# Patient Record
Sex: Male | Born: 1975 | Race: Black or African American | Hispanic: No | Marital: Married | Smoking: Current every day smoker
Health system: Southern US, Community
[De-identification: ages and names within clinical notes are randomized; demographics above are authoritative.]

## PROBLEM LIST (undated history)

## (undated) DIAGNOSIS — I209 Angina pectoris, unspecified: Secondary | ICD-10-CM

## (undated) DIAGNOSIS — M109 Gout, unspecified: Secondary | ICD-10-CM

---

## 2000-05-05 ENCOUNTER — Emergency Department (HOSPITAL_COMMUNITY): Admission: EM | Admit: 2000-05-05 | Discharge: 2000-05-05 | Payer: Self-pay | Admitting: Emergency Medicine

## 2000-05-05 ENCOUNTER — Encounter: Payer: Self-pay | Admitting: Emergency Medicine

## 2003-02-06 ENCOUNTER — Emergency Department (HOSPITAL_COMMUNITY): Admission: EM | Admit: 2003-02-06 | Discharge: 2003-02-06 | Payer: Self-pay | Admitting: Emergency Medicine

## 2009-10-02 ENCOUNTER — Emergency Department (HOSPITAL_BASED_OUTPATIENT_CLINIC_OR_DEPARTMENT_OTHER): Admission: EM | Admit: 2009-10-02 | Discharge: 2009-10-02 | Payer: Self-pay | Admitting: Emergency Medicine

## 2010-07-30 ENCOUNTER — Emergency Department (HOSPITAL_BASED_OUTPATIENT_CLINIC_OR_DEPARTMENT_OTHER): Admission: EM | Admit: 2010-07-30 | Discharge: 2010-07-30 | Payer: Self-pay | Admitting: Emergency Medicine

## 2010-08-18 ENCOUNTER — Encounter
Admission: RE | Admit: 2010-08-18 | Discharge: 2010-08-18 | Payer: Self-pay | Source: Home / Self Care | Attending: Gastroenterology | Admitting: Gastroenterology

## 2010-11-16 LAB — CBC
HCT: 39 % (ref 39.0–52.0)
Hemoglobin: 13.3 g/dL (ref 13.0–17.0)
MCH: 26.9 pg (ref 26.0–34.0)
MCHC: 34 g/dL (ref 30.0–36.0)
MCV: 79.2 fL (ref 78.0–100.0)
Platelets: 217 10*3/uL (ref 150–400)
RBC: 4.93 MIL/uL (ref 4.22–5.81)
RDW: 12.8 % (ref 11.5–15.5)
WBC: 7.2 10*3/uL (ref 4.0–10.5)

## 2010-11-16 LAB — DIFFERENTIAL
Basophils Absolute: 0.1 10*3/uL (ref 0.0–0.1)
Basophils Relative: 1 % (ref 0–1)
Eosinophils Absolute: 0.1 10*3/uL (ref 0.0–0.7)
Eosinophils Relative: 2 % (ref 0–5)
Lymphocytes Relative: 55 % — ABNORMAL HIGH (ref 12–46)
Lymphs Abs: 3.9 10*3/uL (ref 0.7–4.0)
Monocytes Absolute: 0.5 10*3/uL (ref 0.1–1.0)
Monocytes Relative: 7 % (ref 3–12)
Neutro Abs: 2.6 10*3/uL (ref 1.7–7.7)
Neutrophils Relative %: 36 % — ABNORMAL LOW (ref 43–77)

## 2010-11-16 LAB — COMPREHENSIVE METABOLIC PANEL
ALT: 30 U/L (ref 0–53)
AST: 30 U/L (ref 0–37)
Albumin: 3.4 g/dL — ABNORMAL LOW (ref 3.5–5.2)
Alkaline Phosphatase: 59 U/L (ref 39–117)
BUN: 16 mg/dL (ref 6–23)
CO2: 25 mEq/L (ref 19–32)
Calcium: 8.7 mg/dL (ref 8.4–10.5)
Chloride: 111 mEq/L (ref 96–112)
Creatinine, Ser: 1.3 mg/dL (ref 0.4–1.5)
GFR calc Af Amer: >60 mL/min (ref >60)
GFR calc non Af Amer: >60 mL/min (ref >60)
Glucose, Bld: 96 mg/dL (ref 70–99)
Potassium: 4 mEq/L (ref 3.5–5.1)
Sodium: 143 mEq/L (ref 135–145)
Total Bilirubin: 0.4 mg/dL (ref 0.3–1.2)
Total Protein: 5.9 g/dL — ABNORMAL LOW (ref 6.0–8.3)

## 2010-11-16 LAB — KETONES, QUALITATIVE: Acetone, Bld: NEGATIVE

## 2014-01-09 ENCOUNTER — Emergency Department (HOSPITAL_BASED_OUTPATIENT_CLINIC_OR_DEPARTMENT_OTHER)
Admission: EM | Admit: 2014-01-09 | Discharge: 2014-01-09 | Disposition: A | Discharge status: Home / Self Care | Payer: Self-pay | Attending: Emergency Medicine | Admitting: Emergency Medicine

## 2014-01-09 ENCOUNTER — Encounter (HOSPITAL_BASED_OUTPATIENT_CLINIC_OR_DEPARTMENT_OTHER): Payer: Self-pay | Admitting: Emergency Medicine

## 2014-01-09 ENCOUNTER — Emergency Department (HOSPITAL_BASED_OUTPATIENT_CLINIC_OR_DEPARTMENT_OTHER): Payer: Self-pay

## 2014-01-09 DIAGNOSIS — S61219A Laceration without foreign body of unspecified finger without damage to nail, initial encounter: Secondary | ICD-10-CM

## 2014-01-09 DIAGNOSIS — Y929 Unspecified place or not applicable: Secondary | ICD-10-CM | POA: Insufficient documentation

## 2014-01-09 DIAGNOSIS — S61209A Unspecified open wound of unspecified finger without damage to nail, initial encounter: Secondary | ICD-10-CM | POA: Insufficient documentation

## 2014-01-09 DIAGNOSIS — F17200 Nicotine dependence, unspecified, uncomplicated: Secondary | ICD-10-CM | POA: Insufficient documentation

## 2014-01-09 DIAGNOSIS — I209 Angina pectoris, unspecified: Secondary | ICD-10-CM | POA: Insufficient documentation

## 2014-01-09 DIAGNOSIS — W269XXA Contact with unspecified sharp object(s), initial encounter: Secondary | ICD-10-CM | POA: Insufficient documentation

## 2014-01-09 DIAGNOSIS — Z8719 Personal history of other diseases of the digestive system: Secondary | ICD-10-CM | POA: Insufficient documentation

## 2014-01-09 DIAGNOSIS — Y9389 Activity, other specified: Secondary | ICD-10-CM | POA: Insufficient documentation

## 2014-01-09 DIAGNOSIS — Z87448 Personal history of other diseases of urinary system: Secondary | ICD-10-CM | POA: Insufficient documentation

## 2014-01-09 DIAGNOSIS — Z23 Encounter for immunization: Secondary | ICD-10-CM | POA: Insufficient documentation

## 2014-01-09 HISTORY — DX: Gout, unspecified: M10.9

## 2014-01-09 HISTORY — DX: Angina pectoris, unspecified: I20.9

## 2014-01-09 MED ORDER — TETANUS-DIPHTH-ACELL PERTUSSIS 5-2.5-18.5 LF-MCG/0.5 IM SUSP
0.5000 mL | Freq: Once | INTRAMUSCULAR | Status: AC
  Administered 2014-01-09: 0.5 mL via INTRAMUSCULAR
  Filled 2014-01-09: qty 0.5

## 2014-01-09 MED ORDER — OXYCODONE-ACETAMINOPHEN 5-325 MG PO TABS: 1.0000 | ORAL_TABLET | ORAL | Status: AC | PRN

## 2014-01-09 MED ORDER — OXYCODONE-ACETAMINOPHEN 5-325 MG PO TABS: 1.0000 | ORAL_TABLET | ORAL | Status: DC | PRN

## 2014-01-09 MED ORDER — OXYCODONE-ACETAMINOPHEN 5-325 MG PO TABS
1.0000 | ORAL_TABLET | Freq: Once | ORAL | Status: AC
  Administered 2014-01-09: 1 via ORAL
  Filled 2014-01-09: qty 1

## 2014-01-09 NOTE — ED Notes (Signed)
Supplies gathered and placed at bedside for md. Pt is in nad, talking and laughing with this rn and supervisor who is at bedside.

## 2014-01-09 NOTE — ED Provider Notes (Signed)
CSN: [id number]     Arrival date & time 01/09/14  [id number] History   First MD Initiated Contact with Patient 01/09/14 [phone]     Chief Complaint  Patient presents with  . Finger Injury   HPI  38 y.o. male with h/o angina and gout here after he cut his left third digit with a router saw. He is in 9/10 pain. States wound did not bleed much after the injury. He has not had similar injury in the past. He cannot recall his last tetanus shot but thinks he was a child.  Past Medical History  Diagnosis Date  . Angina pectoris   . Gout   Pancreatitis GERD No PCP  History reviewed. No pertinent past surgical history. History reviewed. No pertinent family history. History  Substance Use Topics  . Smoking status: Current Every Day Smoker  . Smokeless tobacco: Not on file  . Alcohol Use: Not on file  Smokes 2-3 cigarettes daily Denies drug use Drinks once every 2-3 months but unable to [employer] Lives with wife and 5 of his 6 kids Works in [employer]  Review of Systems - Per HPI   Allergies  Dilaudid  Home Medications   Prior to Admission medications   Not on File   BP 122/91  Pulse 80  Temp(Src) 98.3 F (36.8 C) (Oral)  Resp 18  SpO2 100% Physical Exam GEN: NAD, pleasant HEENT: Atraumatic, normocephalic, neck supple, EOMI, sclera clear CV: RRR, no murmurs, rubs, or gallops, good cap refill bilaterally all fingers, 1+ bilateral DP pulse PULM: CTAB, normal effort ABD: Soft, nontender, nondistended SKIN: No rash or cyanosis; warm and well-perfused  EXTR: Left third digit with 2cm jagged bleeding laceration at distal palmar surface. Normal flexion and extension of this digit against resistance, though with significant tenderness.  PSYCH: Mood and affect euthymic, normal rate and volume of speech NEURO: Awake, alert, no focal deficits grossly, normal speech  ED Course  LACERATION REPAIR Date/Time: 01/09/2014 10:59 AM Performed by: Leona SingletonHEKKEKANDAM, Magon Croson T Authorized by:  Joya GaskinsWICKLINE, DONALD W Consent: Verbal consent obtained. Risks and benefits: risks, benefits and alternatives were discussed Consent given by: patient Patient understanding: patient states understanding of the procedure being performed Patient consent: the patient's understanding of the procedure matches consent given Procedure consent: procedure consent matches procedure scheduled Imaging studies: imaging studies available Patient identity confirmed: verbally with patient Body area: upper extremity Location details: left long finger Laceration length: 2.5 cm Foreign bodies: no foreign bodies Tendon involvement: none Nerve involvement: none Vascular damage: no Anesthesia: local infiltration Local anesthetic: lidocaine 1% without epinephrine Anesthetic total: 4 ml Patient sedated: no Preparation: Patient was prepped and draped in the usual sterile fashion. Irrigation solution: saline Irrigation method: jet lavage Amount of cleaning: standard Debridement: minimal Skin closure: 3-0 nylon Number of sutures: 3 Technique: simple Approximation: loose Approximation difficulty: simple Dressing: 4x4 sterile gauze, non-adhesive packing strip, antibiotic ointment and splint Patient tolerance: Patient tolerated the procedure well with no immediate complications.   (including critical care time) Labs Review Labs Reviewed - No data to display  Imaging Review Dg Hand Complete Left  01/09/2014   CLINICAL DATA:  Laceration to the left long finger.  EXAM: LEFT HAND - COMPLETE 3+ VIEW  COMPARISON:  None.  FINDINGS: The osseous structures are normal. No radiodense foreign body. No arthritis or other abnormality.  IMPRESSION: Normal exam.   Electronically Signed   By: Geanie CooleyJim  Maxwell M.D.   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 Initially cleaned with beta-dyne bath and cleaned/prepped again  with beta-dyne. Suture closed - see procedure note above. Wound check with no tendon laceration. Patient was neurovascularly intact - TDAP because patient has not had one in >10 years. - Left hand xray showed no foreign body or fracture. - percocet x 1 - check for fb or tendon lac - none - work note provided and pt instructed to keep wound wrapped for 24 hours and then clean and dry. - Suture removal in 10-14 days. - Return precautions reviewed. - D/c'ed with Percocet # 10.  Leona SingletonMaria T Arwa Yero, MD PGY-2, [facility]   Leona SingletonMaria T Yaritzel Stange, MD 01/09/14 [id number]

## 2014-01-09 NOTE — Discharge Instructions (Signed)
Your finger was stitched up. Keep the site clean and dry. Keep the dressing on for 24 hours, then take off and check wound and redress. You can leave it open to the air if you are not doing things that will get it dirty. If you develop any worsened symptoms, fever, pus, or increased swelling, seek immediate care. Have sutures removed by a healthcare professional in 10-14 days.   Emergency Department Resource Guide 1) Find a Doctor and Pay Out of Pocket Although you won't have to find out who is covered by your insurance plan, it is a good idea to ask around and get recommendations. You will then need to call the office and see if the doctor you have chosen will accept you as a new patient and what types of options they offer for patients who are self-pay. Some doctors offer discounts or will set up payment plans for their patients who do not have insurance, but you will need to ask so you aren't surprised when you get to your appointment.  2) Contact Your Local Health Department Not all health departments have doctors that can see patients for sick visits, but many do, so it is worth a call to see if yours does. If you don't know where your local health department is, you can check in your phone book. The CDC also has a tool to help you locate your state's health department, and many state websites also have listings of all of their local health departments.  3) Find a Walk-in Clinic If your illness is not likely to be very severe or complicated, you may want to try a walk in clinic. These are popping up all over the country in pharmacies, drugstores, and shopping centers. They're usually staffed by nurse practitioners or physician assistants that have been trained to treat common illnesses and complaints. They're usually fairly quick and inexpensive. However, if you have serious medical issues or chronic medical problems, these are probably not your best option.  No Primary Care Doctor: - Call  Health Connect at  [phone] - they can help you locate a primary care doctor that  accepts your insurance, provides certain services, etc. - Physician Referral Service- [phone]  Chronic Pain Problems: Organization         Address  Phone   Notes  [facility]  [phone] Patients need to be referred by their primary care doctor.   Medication Assistance: Organization         Address  Phone   Notes  [facility] [address] [place], [place] [address] [phone] --Must be a resident of [facility] -- Must have NO insurance coverage whatsoever (no Medicaid/ Medicare, etc.) -- The pt. MUST have a primary care doctor that directs their care regularly and follows them in the community   [facility]  [phone]) [phone]   [employer]  [phone]    Agencies that provide inexpensive medical care: Organization         Address  Phone   Notes  [facility]  [phone]   [facility]    [phone]   [facility] [address] [place], [place] [address] [phone]   [facility] [address], [place] [phone]   [facility]    [phone]) [phone]   [facility]    [phone]) [phone]   [facility]  [address], [place] Phone:  [phone]) [phone], Fax:  [phone] Hours of Operation:  9 am - 6 pm, M-F.  Also accepts Medicaid/Medicare and self-pay.  [facility]  [address], [place] Phone: [phone](336) [phone], Fax: [phone]. Hours of Operation:  8:30 am - 5:30 pm, M-F.  Also accepts Medicaid and self-pay.  [facility] [place] [address], [place] Phone: [phone](336) [phone]   [facility] [address], [place], [place] [phone], Ext. [phone] Mondays & Thursdays: 7-9 AM.  First 15 patients are seen on a first come, first serve  basis.    Medicaid-accepting [facility] Providers:  Organization         Address  Phone   Notes  [facility] [address], [place] [phone] Also accepts self-pay patients.  [facility] [address], [place]  [phone]) [phone]   [facility] [address], [place] [phone]) [phone]   [facility] [address] [place] [address], [place] [phone]   Renaye RakersVeita Bland [address], [place]   [phone] Only accepts [place] patients after they have their name applied to their card.   Self-Pay (no insurance) in [facility] [place]:  Organization         Address  Phone   Notes  Sickle Cell Patients, [facility] [address], [place] [phone]   [facility] [address], [place] [phone]   [facility]  [address] [place] [address], [place] [phone]   [facility]/Dr. Osei-Bonsu  [address] [place] [address], [place] or [id number], [place] [phone] Phone number for both [place] and [place] locations is the same.  [facility] [address], [place] [phone]   [facility]  [address] [place] [address], [place] or [address] [phone]   [facility] [address], [place] [phone], phone; [phone]) [phone], fax Sees patients 1st and 3rd Saturday of every month.  Must not qualify for public or private insurance (i.e. Medicaid, Medicare, [place] Health Choice, Veterans' Benefits)  Household income should be no more than 200% of the poverty level The clinic cannot treat you if you are pregnant or think you are pregnant  Sexually transmitted diseases are not treated at the clinic.

## 2014-01-09 NOTE — ED Provider Notes (Signed)
Patient seen/examined in the Emergency Department in conjunction with Resident Physician Provider  Patient reports finger pain s/p laceration sustained at work Exam : awake/alert, laceration noted to left third finger, bleeding controlled Plan: will need repair then d/c home Xray reviewed and is negative for acute fracture   Joya Gaskinsonald W Sharmon Cheramie, MD 01/09/14 [id number]

## 2014-01-09 NOTE — ED Provider Notes (Signed)
I have personally seen and examined the patient.  I have discussed plan of care with resident.  I was present for key and critical portions of procedure as documented. I have reviewed the appropriate documentation on PMH/FH/Soc. History.  I have reviewed the documentation of the resident and agree.   [name], MD 01/09/14 1500

## 2014-01-09 NOTE — ED Notes (Signed)
Pt amb to room 1 with quick steady gait in nad. Pt reports cutting his left third digit with a router while at work. dsd in place, bandage removed to reveal minimal amt of bleeding and irregular laceration to tip of finger. Soaked in betadine and saline. Pt is unsure of td status. Denies any other injury or c/o.

## 2014-01-09 NOTE — ED Notes (Signed)
Patient transported to X-ray 

## 2014-01-20 ENCOUNTER — Encounter (HOSPITAL_BASED_OUTPATIENT_CLINIC_OR_DEPARTMENT_OTHER): Payer: Self-pay | Admitting: Emergency Medicine

## 2014-01-20 ENCOUNTER — Emergency Department (HOSPITAL_BASED_OUTPATIENT_CLINIC_OR_DEPARTMENT_OTHER)
Admission: EM | Admit: 2014-01-20 | Discharge: 2014-01-20 | Disposition: A | Discharge status: Home / Self Care | Payer: Self-pay | Attending: Emergency Medicine | Admitting: Emergency Medicine

## 2014-01-20 DIAGNOSIS — F17200 Nicotine dependence, unspecified, uncomplicated: Secondary | ICD-10-CM | POA: Insufficient documentation

## 2014-01-20 DIAGNOSIS — Z8679 Personal history of other diseases of the circulatory system: Secondary | ICD-10-CM | POA: Insufficient documentation

## 2014-01-20 DIAGNOSIS — Z4802 Encounter for removal of sutures: Secondary | ICD-10-CM | POA: Insufficient documentation

## 2014-01-20 DIAGNOSIS — Z862 Personal history of diseases of the blood and blood-forming organs and certain disorders involving the immune mechanism: Secondary | ICD-10-CM | POA: Insufficient documentation

## 2014-01-20 DIAGNOSIS — Z8639 Personal history of other endocrine, nutritional and metabolic disease: Secondary | ICD-10-CM | POA: Insufficient documentation

## 2014-01-20 NOTE — ED Provider Notes (Signed)
CSN: [id number]     Arrival date & time 01/20/14  1249 History   First MD Initiated Contact with Patient 01/20/14 1304     Chief Complaint  Patient presents with  . Suture / Staple Removal     (Consider location/radiation/quality/duration/timing/severity/associated sxs/prior Treatment) Patient is a 38 y.o. male presenting with suture removal.  Suture / Staple Removal   Pt here for wound check. Has sutures placed in L middle finger after saw injury 11 days ago. Reports some increase in pain, but no swelling or drainage.   Past Medical History  Diagnosis Date  . Angina pectoris   . Gout    History reviewed. No pertinent past surgical history. History reviewed. No pertinent family history. History  Substance Use Topics  . Smoking status: Current Every Day Smoker  . Smokeless tobacco: Not on file  . Alcohol Use: Not on file    Review of Systems All other systems reviewed and are negative except as noted in HPI.     Allergies  Dilaudid  Home Medications   Prior to Admission medications   Medication Sig Start Date End Date Taking? Authorizing Provider  oxyCODONE-acetaminophen (ROXICET) 5-325 MG per tablet Take 1 tablet by mouth every 4 (four) hours as needed for severe pain. 01/09/14   Leona SingletonMaria T Thekkekandam, MD   BP 154/99  Pulse 69  Temp(Src) 98.2 F (36.8 C)  Resp 16  Ht 5\' 8"  (1.727 m)  Wt 170 lb (77.111 kg)  BMI 25.85 kg/m2  SpO2 100% Physical Exam  Constitutional: He is oriented to person, place, and time. He appears well-developed and well-nourished.  HENT:  Head: Normocephalic and atraumatic.  Neck: Neck supple.  Pulmonary/Chest: Effort normal.  Musculoskeletal: He exhibits no edema and no tenderness.  Sutures in place in ?flap over L 3rd finger pad injury. The flap looks partially necrosed and may not be viable, no signs of infection  Neurological: He is alert and oriented to person, place, and time. No cranial nerve deficit.  Psychiatric: He has a normal  mood and affect. His behavior is normal.    ED Course  Procedures (including critical care time) Labs Review Labs Reviewed - No data to display  Imaging Review No results found.   EKG Interpretation None      MDM   Final diagnoses:  Visit for suture removal    Sutures removed x 3. Part of the necrosed flap has come off with normal healing granulation tissue underneath. Advised continued use of antibiotic ointment, protect finger to allow for continued healing.    Charles B. Bernette MayersSheldon, MD 01/20/14 1316

## 2014-01-20 NOTE — Discharge Instructions (Signed)
Suture Removal, Care After Refer to this sheet in the next few weeks. These instructions provide you with information on caring for yourself after your procedure. Your health care provider may also give you more specific instructions. Your treatment has been planned according to current medical practices, but problems sometimes occur. Call your health care provider if you have any problems or questions after your procedure. WHAT TO EXPECT AFTER THE PROCEDURE After your stitches (sutures) are removed, it is typical to have the following:  Some discomfort and swelling in the wound area.  Slight redness in the area. HOME CARE INSTRUCTIONS   If you have skin adhesive strips over the wound area, do not take the strips off. They will fall off on their own in a few days. If the strips remain in place after 14 days, you may remove them.  Change any bandages (dressings) at least once a day or as directed by your health care provider. If the bandage sticks, soak it off with warm, soapy water.  Apply cream or ointment only as directed by your health care provider. If using cream or ointment, wash the area with soap and water 2 times a day to remove all the cream or ointment. Rinse off the soap and pat the area dry with a clean towel.  Keep the wound area dry and clean. If the bandage becomes wet or dirty, or if it develops a bad smell, change it as soon as possible.  Continue to protect the wound from injury.  Use sunscreen when out in the sun. New scars become sunburned easily. SEEK MEDICAL CARE IF:  You have increasing redness, swelling, or pain in the wound.  You see pus coming from the wound.  You have a fever.  You notice a bad smell coming from the wound or dressing.  Your wound breaks open (edges not staying together). Document Released: 05/17/2001 Document Revised: 06/12/2013 Document Reviewed: 04/03/2013 ExitCare Patient Information 2014 [employer].  

## 2014-01-20 NOTE — ED Notes (Signed)
Here for suture removal last seen 5/7

## 2014-01-22 NOTE — ED Notes (Signed)
Pt called and sts he did not receive work note during last visit. Note was printed and this nurse re-printed. Pt will pick up at front desk.

## 2016-09-03 ENCOUNTER — Emergency Department (HOSPITAL_BASED_OUTPATIENT_CLINIC_OR_DEPARTMENT_OTHER)
Admission: EM | Admit: 2016-09-03 | Discharge: 2016-09-03 | Disposition: A | Discharge status: Home / Self Care | Payer: BLUE CROSS/BLUE SHIELD | Attending: Emergency Medicine | Admitting: Emergency Medicine

## 2016-09-03 ENCOUNTER — Encounter (HOSPITAL_BASED_OUTPATIENT_CLINIC_OR_DEPARTMENT_OTHER): Payer: Self-pay | Admitting: *Deleted

## 2016-09-03 ENCOUNTER — Emergency Department (HOSPITAL_BASED_OUTPATIENT_CLINIC_OR_DEPARTMENT_OTHER): Payer: BLUE CROSS/BLUE SHIELD

## 2016-09-03 DIAGNOSIS — Y939 Activity, unspecified: Secondary | ICD-10-CM | POA: Insufficient documentation

## 2016-09-03 DIAGNOSIS — F17210 Nicotine dependence, cigarettes, uncomplicated: Secondary | ICD-10-CM | POA: Diagnosis not present

## 2016-09-03 DIAGNOSIS — Y929 Unspecified place or not applicable: Secondary | ICD-10-CM | POA: Diagnosis not present

## 2016-09-03 DIAGNOSIS — Y999 Unspecified external cause status: Secondary | ICD-10-CM | POA: Insufficient documentation

## 2016-09-03 DIAGNOSIS — S39012A Strain of muscle, fascia and tendon of lower back, initial encounter: Secondary | ICD-10-CM | POA: Diagnosis not present

## 2016-09-03 DIAGNOSIS — S3992XA Unspecified injury of lower back, initial encounter: Secondary | ICD-10-CM | POA: Diagnosis present

## 2016-09-03 DIAGNOSIS — X58XXXA Exposure to other specified factors, initial encounter: Secondary | ICD-10-CM | POA: Diagnosis not present

## 2016-09-03 MED ORDER — HYDROCODONE-ACETAMINOPHEN 5-325 MG PO TABS: 2.0000 | ORAL_TABLET | ORAL | 0 refills | Status: AC | PRN

## 2016-09-03 MED ORDER — KETOROLAC TROMETHAMINE 30 MG/ML IJ SOLN
30.0000 mg | Freq: Once | INTRAMUSCULAR | Status: AC
  Administered 2016-09-03: 30 mg via INTRAMUSCULAR
  Filled 2016-09-03: qty 1

## 2016-09-03 MED ORDER — CYCLOBENZAPRINE HCL 10 MG PO TABS: 10.0000 mg | ORAL_TABLET | Freq: Two times a day (BID) | ORAL | 0 refills | Status: AC | PRN

## 2016-09-03 MED ORDER — HYDROCODONE-ACETAMINOPHEN 5-325 MG PO TABS
1.0000 | ORAL_TABLET | Freq: Once | ORAL | Status: AC
  Administered 2016-09-03: 1 via ORAL
  Filled 2016-09-03: qty 1

## 2016-09-03 MED ORDER — METHYLPREDNISOLONE 4 MG PO TBPK: ORAL_TABLET | ORAL | 0 refills | Status: AC

## 2016-09-03 NOTE — Discharge Instructions (Signed)
Your x-ray shows no acute findings. This is likely a muscular sprain. I have given you a short course of steroids. I also given a prescription for Flexeril which is a muscle relaxer and a few pain pills. Please try to avoid taking these at the same time as they can cause drowsiness. Do not drive with this medications. Avoid taking NSAIDs for the rest of the day. Continue using a heating pad and alternate with ice to your lower back. I have given you a follow-up referral [facility] if your symptoms do not improve in the next 2-3 days. Return to the ED if he develop any loss of your bowel or bladder, or unable to urinate, have numbness and tingling in her groin during her lower extremities.

## 2016-09-03 NOTE — ED Notes (Signed)
ED Provider at bedside. 

## 2016-09-03 NOTE — ED Provider Notes (Signed)
[facility] Provider Note   CSN: [id number] Arrival date & time: 09/03/16  1001     History   Chief Complaint Chief Complaint  Patient presents with  . Back Pain    HPI Glenn Davidson is a 40 y.o. male.  40 year old African-American male with no significant past medical history presents to the ED today with complaint of low back pain 1 week. Patient states that he was going from a sitting to standing position when he coughed and experienced sudden severe back pain. Patient states the pain radiated down his left leg which has since resolved. He states. Makes the pain worse. He has tried Tylenol and ibuprofen with some relief. Patient states that he's had back injury in the past due to football trauma in high school. Patient does report transient relief with ice application. He denies any loss of bowel or bladder, urinary retention, saddle paresthesias, lower extremity paresthesias, history of IV drug use, history of cancer. Denies any fever, chills, headache, vision changes, neck pain, nausea, emesis, urinary symptoms, change in bowel habits, numbness/tingling. Patient is able to ambulate but with pain.      Past Medical History:  Diagnosis Date  . Angina pectoris (HCC)   . Gout     There are no active problems to display for this patient.   History reviewed. No pertinent surgical history.     Home Medications    Prior to Admission medications   Medication Sig Start Date End Date Taking? Authorizing Provider  oxyCODONE-acetaminophen (ROXICET) 5-325 MG per tablet Take 1 tablet by mouth every 4 (four) hours as needed for severe pain. 01/09/14   Leona SingletonMaria T Thekkekandam, MD    Family History No family history on file.  Social History Social History  Substance Use Topics  . Smoking status: Current Every Day Smoker    Types: Cigarettes  . Smokeless tobacco: Never Used  . Alcohol use Yes     Comment: occ     Allergies   Dilaudid [hydromorphone  hcl]   Review of Systems Review of Systems  Constitutional: Negative for chills and fever.  Musculoskeletal: Positive for back pain and gait problem. Negative for neck pain and neck stiffness.  Skin: Negative.   Neurological: Negative for dizziness, weakness, light-headedness and headaches.  All other systems reviewed and are negative.    Physical Exam Updated Vital Signs BP 126/91 (BP Location: Right Arm)   Pulse (!) 59   Temp 98.6 F (37 C) (Oral)   Resp 18   Ht 5\' 7"  (1.702 m)   Wt 79.4 kg   SpO2 100%   BMI 27.41 kg/m   Physical Exam  Constitutional: He is oriented to person, place, and time. He appears well-developed and well-nourished. No distress.  HENT:  Head: Normocephalic and atraumatic.  Eyes: Conjunctivae and EOM are normal. Pupils are equal, round, and reactive to light.  Neck: Normal range of motion. Neck supple.  No midline C-spine tenderness. No deformity or step-offs noted.  Cardiovascular: Normal rate and regular rhythm.   Musculoskeletal:  Patient ambulated to the room with no issues and gait. He does have midline L-spine tenderness. No deformity or step-offs noted. Patient also exhibits right paraspinal lumbar tenderness with palpation that radiates to his left buttocks. Patient has no midline T-spine tenderness. No deformity or step-offs noted. Full range of motion. Strength is 5 out of 5 in lower extremities. Sensation is intact to sharp/dull and proprioception. DP pulses are 2+ bilaterally. Cap refill is normal.  Lymphadenopathy:  He has no cervical adenopathy.  Neurological: He is alert and oriented to person, place, and time. GCS eye subscore is 4. GCS verbal subscore is 5. GCS motor subscore is 6.  Reflex Scores:      Patellar reflexes are 2+ on the right side and 2+ on the left side.      Achilles reflexes are 2+ on the right side and 2+ on the left side. Skin: Skin is warm and dry. Capillary refill takes less than 2 seconds.  Nursing note and  vitals reviewed.    ED Treatments / Results  Labs (all labs ordered are listed, but only abnormal results are displayed) Labs Reviewed - No data to display  EKG  EKG Interpretation None       Radiology No results found.  Procedures Procedures (including critical care time)  Medications Ordered in ED Medications  ketorolac (TORADOL) 30 MG/ML injection 30 mg (30 mg Intramuscular Given 09/03/16 1109)  HYDROcodone-acetaminophen (NORCO/VICODIN) 5-325 MG per tablet 1 tablet (1 tablet Oral Given 09/03/16 1109)     Initial Impression / Assessment and Plan / ED Course  I have reviewed the triage vital signs and the nursing notes.  Pertinent labs & imaging results that were available during my care of the patient were reviewed by me and considered in my medical decision making (see chart for details).  Clinical Course   Patient with back pain.  No neurological deficits and normal neuro exam.  Patient can walk but states is painful.  No loss of bowel or bladder control.  No concern for cauda equina.  No fever, night sweats, weight loss, h/o cancer, IVDU. Xray without acute changes shows known injury that is unchanged from previous injury. Will discharge with steroids, muscle relaxer and short course of pain medicine. Patient given referral to [facility] If Symptoms Do Not Improve. RICE protocol and pain medicine indicated and discussed with patient. Pt is hemodynamically stable, in NAD, & able to ambulate in the ED. Pain has been managed & has no complaints prior to dc. Pt is comfortable with above plan and is stable for discharge at this time. All questions were answered prior to disposition. Strict return precautions for f/u to the ED were discussed.    Final Clinical Impressions(s) / ED Diagnoses   Final diagnoses:  Strain of lumbar region, initial encounter    New Prescriptions Discharge Medication List as of 09/03/2016 11:18 AM    START taking these medications    Details  cyclobenzaprine (FLEXERIL) 10 MG tablet Take 1 tablet (10 mg total) by mouth 2 (two) times daily as needed for muscle spasms., Starting Sat 09/03/2016, Print    HYDROcodone-acetaminophen (NORCO/VICODIN) 5-325 MG tablet Take 2 tablets by mouth every 4 (four) hours as needed., Starting Sat 09/03/2016, Print    methylPREDNISolone (MEDROL DOSEPAK) 4 MG TBPK tablet 6 po on day 1, decrease by 1 tab per day, Print         Rise MuKenneth T Carlo Lorson, PA-C 09/03/16 1137    Rise MuKenneth T Swetha Rayle, PA-C 09/03/16 1140    Rolland PorterMark James, MD 09/17/16 [phone]

## 2016-09-03 NOTE — ED Triage Notes (Addendum)
Pt reports lower back pain x1wk. Pt states he was going from sitting to standing when he coughed and experienced sudden, severe back pain. Denies fever, n/v/d, genitourinary symptoms, incontinence, chest pain, sob. States pain sometimes radiates down L leg. Reports transient relief with ice application, Motrin and Tylenol. Reports hx of back injury in the past.

## 2021-09-13 ENCOUNTER — Emergency Department (HOSPITAL_BASED_OUTPATIENT_CLINIC_OR_DEPARTMENT_OTHER)
Admission: EM | Admit: 2021-09-13 | Discharge: 2021-09-13 | Disposition: A | Discharge status: Home / Self Care | Payer: BLUE CROSS/BLUE SHIELD | Attending: Emergency Medicine | Admitting: Emergency Medicine

## 2021-09-13 ENCOUNTER — Other Ambulatory Visit: Payer: Self-pay

## 2021-09-13 ENCOUNTER — Encounter (HOSPITAL_BASED_OUTPATIENT_CLINIC_OR_DEPARTMENT_OTHER): Payer: Self-pay | Admitting: *Deleted

## 2021-09-13 DIAGNOSIS — M545 Low back pain, unspecified: Secondary | ICD-10-CM

## 2021-09-13 LAB — URINALYSIS, ROUTINE W REFLEX MICROSCOPIC
Bilirubin Urine: NEGATIVE
Glucose, UA: NEGATIVE mg/dL
Hgb urine dipstick: NEGATIVE
Ketones, ur: NEGATIVE mg/dL
Leukocytes,Ua: NEGATIVE
Nitrite: NEGATIVE
Protein, ur: NEGATIVE mg/dL
Specific Gravity, Urine: 1.02 (ref 1.005–1.030)
pH: 6 (ref 5.0–8.0)

## 2021-09-13 MED ORDER — LIDOCAINE 5 % EX PTCH: 1.0000 | MEDICATED_PATCH | CUTANEOUS | 0 refills | Status: AC

## 2021-09-13 MED ORDER — METHOCARBAMOL 500 MG PO TABS: 500.0000 mg | ORAL_TABLET | Freq: Two times a day (BID) | ORAL | 0 refills | Status: AC

## 2021-09-13 MED ORDER — ACETAMINOPHEN 325 MG PO TABS
650.0000 mg | ORAL_TABLET | Freq: Once | ORAL | Status: AC
  Administered 2021-09-13: 650 mg via ORAL
  Filled 2021-09-13: qty 2

## 2021-09-13 MED ORDER — NAPROXEN 500 MG PO TABS: 500.0000 mg | ORAL_TABLET | Freq: Two times a day (BID) | ORAL | 0 refills | Status: AC

## 2021-09-13 NOTE — Discharge Instructions (Signed)
I have sent the muscle relaxant, methocarbamol, to your pharmacy.  There are also lidocaine patches as we discussed.  I have also sent Aleve that you may take twice daily.  Please do not take this with ibuprofen.  You may use Tylenol in addition to these medications.  I have attached a work note as well.

## 2021-09-13 NOTE — ED Triage Notes (Signed)
Lower back pain since changing a tire on his vehicle 3 days ago. Pt is ambulatory.

## 2021-09-13 NOTE — ED Provider Notes (Signed)
[facility] EMERGENCY DEPARTMENT Provider Note   CSN: [id number] Arrival date & time: 09/13/21  1212     History  Chief Complaint  Patient presents with   Back Pain    Glenn Davidson is a 46 y.o. male resenting today with a complaint of low back pain after hurting his back when changing a tire 3 days ago.  Describes his pain as throbbing and localizes it to the right side.  No bowel/bladder dysfunction, fevers, chills, history of malignancy, saddle anesthesia or extremity numbness/weakness.  Has been utilizing ibuprofen which is not helping.      Home Medications Prior to Admission medications   Medication Sig Start Date End Date Taking? Authorizing Provider  lidocaine (LIDODERM) 5 % Place 1 patch onto the skin daily. Remove & Discard patch within 12 hours or as directed by MD 09/13/21  Yes Channie Bostick A, PA-C  methocarbamol (ROBAXIN) 500 MG tablet Take 1 tablet (500 mg total) by mouth 2 (two) times daily. 09/13/21  Yes Allianna Beaubien A, PA-C  naproxen (NAPROSYN) 500 MG tablet Take 1 tablet (500 mg total) by mouth 2 (two) times daily. 09/13/21  Yes Haydin Dunn A, PA-C  cyclobenzaprine (FLEXERIL) 10 MG tablet Take 1 tablet (10 mg total) by mouth 2 (two) times daily as needed for muscle spasms. 09/03/16   Rise Mu, PA-C  HYDROcodone-acetaminophen (NORCO/VICODIN) 5-325 MG tablet Take 2 tablets by mouth every 4 (four) hours as needed. 09/03/16   Rise Mu, PA-C  methylPREDNISolone (MEDROL DOSEPAK) 4 MG TBPK tablet 6 po on day 1, decrease by 1 tab per day 09/03/16   Demetrios Loll T, PA-C  oxyCODONE-acetaminophen (ROXICET) 5-325 MG per tablet Take 1 tablet by mouth every 4 (four) hours as needed for severe pain. 01/09/14   Leona Singleton, MD      Allergies    Dilaudid [hydromorphone hcl]    Review of Systems   Review of Systems  Musculoskeletal:  Positive for back pain.   Physical Exam Updated Vital Signs BP 125/90 (BP Location: Right Arm)     Pulse (!) 56    Temp 98.1 F (36.7 C) (Oral)    Resp 18    Ht 5\' 7"  (1.702 m)    Wt 72.6 kg    SpO2 100%    BMI 25.06 kg/m  Physical Exam Vitals and nursing note reviewed.  Constitutional:      Appearance: Normal appearance.  HENT:     Head: Normocephalic and atraumatic.  Eyes:     General: No scleral icterus.    Conjunctiva/sclera: Conjunctivae normal.  Pulmonary:     Effort: Pulmonary effort is normal. No respiratory distress.  Musculoskeletal:        General: Tenderness present. No swelling, deformity or signs of injury.     Comments: Tension and tenderness in the right lower back.  No midline tenderness.  Skin:    Findings: No bruising, lesion or rash.  Neurological:     Mental Status: He is alert.  Psychiatric:        Mood and Affect: Mood normal.    ED Results / Procedures / Treatments   Labs (all labs ordered are listed, but only abnormal results are displayed) Labs Reviewed  URINALYSIS, ROUTINE W REFLEX MICROSCOPIC    EKG None  Radiology No results found.  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  Localizes to the right lower back.  Physical exam reveals tenderness on palpation of muscles.  No midline tenderness.  No concern for spinal cord involvement or vertebral injury.  Stable for discharge with muscle relaxants, naproxen and lidocaine patches.  Proper use discussed with the patient and attached to his discharge papers.  He is agreeable to this plan.  Final Clinical Impression(s) / ED Diagnoses Final diagnoses:  Acute right-sided low back pain without sciatica    Rx / DC Orders ED Discharge Orders          Ordered    lidocaine (LIDODERM) 5 %  Every 24 hours        09/13/21 1437     methocarbamol (ROBAXIN) 500 MG tablet  2 times daily        09/13/21 1437    naproxen (NAPROSYN) 500 MG tablet  2 times daily        09/13/21 1437           Results and diagnoses were explained to the patient. Return precautions discussed in full. Patient had no additional questions and expressed complete understanding.   This chart was dictated using voice recognition software.  Despite best efforts to proofread,  errors can occur which can change the documentation meaning.    Saddie Benders, PA-C 09/13/21 1520    Rolan Bucco, MD 09/13/21 1523

## 2023-04-16 ENCOUNTER — Emergency Department (HOSPITAL_BASED_OUTPATIENT_CLINIC_OR_DEPARTMENT_OTHER)
Admission: EM | Admit: 2023-04-16 | Discharge: 2023-04-16 | Disposition: A | Discharge status: Home / Self Care | Payer: Self-pay | Attending: Emergency Medicine | Admitting: Emergency Medicine

## 2023-04-16 ENCOUNTER — Encounter (HOSPITAL_BASED_OUTPATIENT_CLINIC_OR_DEPARTMENT_OTHER): Payer: Self-pay | Admitting: Emergency Medicine

## 2023-04-16 ENCOUNTER — Other Ambulatory Visit (HOSPITAL_BASED_OUTPATIENT_CLINIC_OR_DEPARTMENT_OTHER): Payer: Self-pay

## 2023-04-16 DIAGNOSIS — H60502 Unspecified acute noninfective otitis externa, left ear: Secondary | ICD-10-CM | POA: Insufficient documentation

## 2023-04-16 MED ORDER — CIPROFLOXACIN-DEXAMETHASONE 0.3-0.1 % OT SUSP
4.0000 [drp] | Freq: Once | OTIC | Status: AC
  Administered 2023-04-16: 4 [drp] via OTIC
  Filled 2023-04-16: qty 7.5

## 2023-04-16 MED ORDER — CIPROFLOXACIN-DEXAMETHASONE 0.3-0.1 % OT SUSP
4.0000 [drp] | Freq: Two times a day (BID) | OTIC | 0 refills | Status: AC
  Filled 2023-04-16: qty 7.5, 7d supply, fill #0

## 2023-04-16 MED ORDER — IBUPROFEN 400 MG PO TABS
600.0000 mg | ORAL_TABLET | Freq: Once | ORAL | Status: AC
  Administered 2023-04-16: 600 mg via ORAL
  Filled 2023-04-16: qty 1

## 2023-04-16 NOTE — ED Provider Notes (Signed)
[place] EMERGENCY DEPARTMENT AT [facility] Provider Note   CSN: [id number] Arrival date & time: 04/16/23  1021     History  Chief Complaint  Patient presents with   Facial Swelling    Glenn Davidson is a 47 y.o. male.   Flank Pain  47 year old male previously healthy presenting for left ear pain.  Since Friday he has had swelling and pain to the left ear canal.  He states he has used Q-tips recently.  No history of prior ear infections.  No fevers or chills.  Hurts somewhat to move his jaw.  No headache or neck pain.  Slight lymphadenopathy of the left side of his neck.  He is otherwise well.  Is not diabetic, no other medical problems.     Home Medications Prior to Admission medications   Medication Sig Start Date End Date Taking? Authorizing Provider  cyclobenzaprine (FLEXERIL) 10 MG tablet Take 1 tablet (10 mg total) by mouth 2 (two) times daily as needed for muscle spasms. 09/03/16   Rise Mu, PA-C  HYDROcodone-acetaminophen (NORCO/VICODIN) 5-325 MG tablet Take 2 tablets by mouth every 4 (four) hours as needed. 09/03/16   Demetrios Loll T, PA-C  lidocaine (LIDODERM) 5 % Place 1 patch onto the skin daily. Remove & Discard patch within 12 hours or as directed by MD 09/13/21   Redwine, Madison A, PA-C  methocarbamol (ROBAXIN) 500 MG tablet Take 1 tablet (500 mg total) by mouth 2 (two) times daily. 09/13/21   Redwine, Madison A, PA-C  methylPREDNISolone (MEDROL DOSEPAK) 4 MG TBPK tablet 6 po on day 1, decrease by 1 tab per day 09/03/16   Demetrios Loll T, PA-C  naproxen (NAPROSYN) 500 MG tablet Take 1 tablet (500 mg total) by mouth 2 (two) times daily. 09/13/21   Redwine, Madison A, PA-C  oxyCODONE-acetaminophen (ROXICET) 5-325 MG per tablet Take 1 tablet by mouth every 4 (four) hours as needed for severe pain. 01/09/14   Leona Singleton, MD      Allergies    Hydromorphone and Dilaudid [hydromorphone hcl]    Review of Systems   Review of  Systems Review of systems completed and notable as per HPI.  ROS otherwise negative.   Physical Exam Updated Vital Signs BP (!) 146/92   Pulse (!) 56   Temp 98.4 F (36.9 C)   Resp 18   Ht 5\' 8"  (1.727 m)   Wt 72.6 kg   SpO2 100%   BMI 24.33 kg/m  Physical Exam Vitals and nursing note reviewed.  Constitutional:      General: He is not in acute distress.    Appearance: He is well-developed.  HENT:     Head: Normocephalic and atraumatic.     Right Ear: Tympanic membrane, ear canal and external ear normal.     Left Ear: Tympanic membrane normal.     Ears:     Comments: Left ear canal edematous and swollen mildly tender.  Ear canal is not swollen shut.  He has mild tenderness of the pinna.  Slight left-sided cervical lymphadenopathy.  No fluctuance, erythema, cellulitis around the ear.  No mastoid tenderness.  Mouth clear, no trismus.    Nose: Nose normal.     Mouth/Throat:     Mouth: Mucous membranes are moist.     Pharynx: Oropharynx is clear.  Eyes:     Extraocular Movements: Extraocular movements intact.     Conjunctiva/sclera: Conjunctivae normal.     Pupils: Pupils are equal, round, and  reactive to light.  Cardiovascular:     Rate and Rhythm: Normal rate and regular rhythm.     Heart sounds: No murmur heard. Pulmonary:     Effort: Pulmonary effort is normal. No respiratory distress.     Breath sounds: Normal breath sounds.  Musculoskeletal:        General: No swelling.     Cervical back: Normal range of motion and neck supple. No rigidity or tenderness.  Skin:    General: Skin is warm and dry.     Capillary Refill: Capillary refill takes less than 2 seconds.  Neurological:     Mental Status: He is alert.  Psychiatric:        Mood and Affect: Mood normal.     ED Results / Procedures / Treatments   Labs (all labs ordered are listed, but only abnormal results are displayed) Labs Reviewed - No data to display  EKG None  Radiology No results  found.  Procedures Procedures    Medications Ordered in ED Medications  ibuprofen (ADVIL) tablet 600 mg (has no administration in time range)    ED Course/ Medical Decision Making/ A&P                                 Medical Decision Making  Medical Decision Making:   Glenn Davidson is a 47 y.o. male who presented to the ED today with left ear pain and swelling.  Vital signs reviewed.  Exam he is well-appearing.  He is inflammation of the left outer ear canal consistent with otitis externa.  He is eardrum is normal, nonperforated without signs of otitis media.  He has no mastoid tenderness, low concern for osteomyelitis or mastoiditis.  No cellulitis outside of the ear canal, no signs of malignant otitis externa.  Will plan to treat with course of antibiotic eardrops and give ENT and PCP follow-up.  Strict return precautions were given.  Discharged in stable condition.  Patient's presentation is most consistent with acute, uncomplicated illness.         Final Clinical Impression(s) / ED Diagnoses Final diagnoses:  Acute otitis externa of left ear, unspecified type    Rx / DC Orders ED Discharge Orders     None         Laurence Spates, MD 04/16/23 1154

## 2023-04-16 NOTE — ED Triage Notes (Addendum)
Pt with pain to LT ear since Fri, now swollen and pain is worse

## 2023-04-16 NOTE — Discharge Instructions (Signed)
You have an infection of the ear canal on the left side.  You should complete the entire course of antibiotics.  You can use the provided eardrops here, and you may not need to refill the prescription if this gets you through the whole week.  You should follow-up with ENT and your primary care doctor.  If you develop severe pain, severe headache, fevers or worsening swelling you should return to the ED.

## 2023-04-16 NOTE — ED Notes (Signed)
Ear wick placed

## 2023-04-17 ENCOUNTER — Other Ambulatory Visit (HOSPITAL_BASED_OUTPATIENT_CLINIC_OR_DEPARTMENT_OTHER): Payer: Self-pay

## 2023-05-01 ENCOUNTER — Other Ambulatory Visit (HOSPITAL_BASED_OUTPATIENT_CLINIC_OR_DEPARTMENT_OTHER): Payer: Self-pay

## 2023-05-29 ENCOUNTER — Other Ambulatory Visit (HOSPITAL_BASED_OUTPATIENT_CLINIC_OR_DEPARTMENT_OTHER): Payer: Self-pay

## 2023-05-29 ENCOUNTER — Emergency Department (HOSPITAL_BASED_OUTPATIENT_CLINIC_OR_DEPARTMENT_OTHER)
Admission: EM | Admit: 2023-05-29 | Discharge: 2023-05-29 | Disposition: A | Discharge status: Home / Self Care | Payer: Self-pay | Attending: Emergency Medicine | Admitting: Emergency Medicine

## 2023-05-29 ENCOUNTER — Other Ambulatory Visit: Payer: Self-pay

## 2023-05-29 DIAGNOSIS — Z20822 Contact with and (suspected) exposure to covid-19: Secondary | ICD-10-CM | POA: Insufficient documentation

## 2023-05-29 DIAGNOSIS — J0100 Acute maxillary sinusitis, unspecified: Secondary | ICD-10-CM | POA: Insufficient documentation

## 2023-05-29 LAB — RESP PANEL BY RT-PCR (RSV, FLU A&B, COVID)  RVPGX2
Influenza A by PCR: NEGATIVE
Influenza B by PCR: NEGATIVE
Resp Syncytial Virus by PCR: NEGATIVE
SARS Coronavirus 2 by RT PCR: NEGATIVE

## 2023-05-29 LAB — GROUP A STREP BY PCR: Group A Strep by PCR: NOT DETECTED

## 2023-05-29 MED ORDER — AMOXICILLIN-POT CLAVULANATE 875-125 MG PO TABS
1.0000 | ORAL_TABLET | Freq: Two times a day (BID) | ORAL | 0 refills | Status: AC
  Filled 2023-05-29: qty 14, 7d supply, fill #0

## 2023-05-29 MED ORDER — AMOXICILLIN-POT CLAVULANATE 875-125 MG PO TABS
1.0000 | ORAL_TABLET | Freq: Once | ORAL | Status: AC
  Administered 2023-05-29: 1 via ORAL
  Filled 2023-05-29: qty 1

## 2023-05-29 NOTE — Discharge Instructions (Signed)
Take tylenol 2 pills 4 times a day and motrin 4 pills 3 times a day.  Drink plenty of fluids.  Return for worsening shortness of breath, headache, confusion. Follow up with your family doctor.   

## 2023-05-29 NOTE — ED Provider Notes (Signed)
[place] EMERGENCY DEPARTMENT AT [facility] Provider Note   CSN: [id number] Arrival date & time: 05/29/23  5573     History  Chief Complaint  Patient presents with   Sore Throat   Facial Pain    Glenn Davidson is a 47 y.o. male.  47 yo M with a chief complaints of right-sided facial pain.  Patient tells me that he gets sinus infections every so often and this feels like 1 but is a bit more severe than typical.  Has felt a little unwell over the weekend but much worse this morning.  No known sick contacts.  A little bit of congestion and feeling hot and cold this morning.  Feels like his right upper teeth are a bit numb.   Sore Throat       Home Medications Prior to Admission medications   Medication Sig Start Date End Date Taking? Authorizing Provider  amoxicillin-clavulanate (AUGMENTIN) 875-125 MG tablet Take 1 tablet by mouth every 12 (twelve) hours. 05/29/23  Yes Melene Plan, DO  cyclobenzaprine (FLEXERIL) 10 MG tablet Take 1 tablet (10 mg total) by mouth 2 (two) times daily as needed for muscle spasms. 09/03/16   Rise Mu, PA-C  HYDROcodone-acetaminophen (NORCO/VICODIN) 5-325 MG tablet Take 2 tablets by mouth every 4 (four) hours as needed. 09/03/16   Demetrios Loll T, PA-C  lidocaine (LIDODERM) 5 % Place 1 patch onto the skin daily. Remove & Discard patch within 12 hours or as directed by MD 09/13/21   Redwine, Madison A, PA-C  methocarbamol (ROBAXIN) 500 MG tablet Take 1 tablet (500 mg total) by mouth 2 (two) times daily. 09/13/21   Redwine, Madison A, PA-C  methylPREDNISolone (MEDROL DOSEPAK) 4 MG TBPK tablet 6 po on day 1, decrease by 1 tab per day 09/03/16   Demetrios Loll T, PA-C  naproxen (NAPROSYN) 500 MG tablet Take 1 tablet (500 mg total) by mouth 2 (two) times daily. 09/13/21   Redwine, Madison A, PA-C  oxyCODONE-acetaminophen (ROXICET) 5-325 MG per tablet Take 1 tablet by mouth every 4 (four) hours as needed for severe pain. 01/09/14    Leona Singleton, MD      Allergies    Hydromorphone and Dilaudid [hydromorphone hcl]    Review of Systems   Review of Systems  Physical Exam Updated Vital Signs BP (!) 156/108   Pulse 74   Temp 99.2 F (37.3 C)   Resp 18   Ht 5\' 8"  (1.727 m)   Wt 74.8 kg   SpO2 100%   BMI 25.09 kg/m  Physical Exam Vitals and nursing note reviewed.  Constitutional:      Appearance: He is well-developed.  HENT:     Head: Normocephalic and atraumatic.     Comments: Swollen turbinates posterior nasal drip no obvious tonsillar swelling or exudates.  Uvula is midline.  No obvious dental pathology.  Pain with percussion over the right maxillary sinus.  Right TM with a serous effusion with no erythema but with bulging and distortion of landmarks. Eyes:     Pupils: Pupils are equal, round, and reactive to light.  Neck:     Vascular: No JVD.  Cardiovascular:     Rate and Rhythm: Normal rate and regular rhythm.     Heart sounds: No murmur heard.    No friction rub. No gallop.  Pulmonary:     Effort: No respiratory distress.     Breath sounds: No wheezing.  Abdominal:     General: There is  no distension.     Tenderness: There is no abdominal tenderness. There is no guarding or rebound.  Musculoskeletal:        General: Normal range of motion.     Cervical back: Normal range of motion and neck supple.  Skin:    Coloration: Skin is not pale.     Findings: No rash.  Neurological:     Mental Status: He is alert and oriented to person, place, and time.  Psychiatric:        Behavior: Behavior normal.     ED Results / Procedures / Treatments   Labs (all labs ordered are listed, but only abnormal results are displayed) Labs Reviewed  RESP PANEL BY RT-PCR (RSV, FLU A&B, COVID)  [id number]  GROUP A STREP BY PCR    EKG None  Radiology No results found.  Procedures Procedures    Medications Ordered in ED Medications  amoxicillin-clavulanate (AUGMENTIN) 875-125 MG per tablet 1  tablet (has no administration in time range)    ED Course/ Medical Decision Making/ A&P                                 Medical Decision Making Risk Prescription drug management.   47 yo M with a chief complaints of right-sided facial pain.  Feels likely has had sinusitis previously.  Clinically the patient does have acute sinusitis.  With severe facial pain will treat with antibiotics per the most recent IDSA guidelines.  Will have him follow-up with his family doctor in the office.  9:21 AM:  I have discussed the diagnosis/risks/treatment options with the patient.  Evaluation and diagnostic testing in the emergency department does not suggest an emergent condition requiring admission or immediate intervention beyond what has been performed at this time.  They will follow up with PCP. We also discussed returning to the ED immediately if new or worsening sx occur. We discussed the sx which are most concerning (e.g., sudden worsening pain, fever, inability to tolerate by mouth) that necessitate immediate return. Medications administered to the patient during their visit and any new prescriptions provided to the patient are listed below.  Medications given during this visit Medications  amoxicillin-clavulanate (AUGMENTIN) 875-125 MG per tablet 1 tablet (has no administration in time range)     The patient appears reasonably screen and/or stabilized for discharge and I doubt any other medical condition or other [facility] requiring further screening, evaluation, or treatment in the ED at this time prior to discharge.          Final Clinical Impression(s) / ED Diagnoses Final diagnoses:  Acute maxillary sinusitis, recurrence not specified    Rx / DC Orders ED Discharge Orders          Ordered    amoxicillin-clavulanate (AUGMENTIN) 875-125 MG tablet  Every 12 hours        05/29/23 0917              Melene Plan, DO 05/29/23 [phone]

## 2023-05-29 NOTE — ED Triage Notes (Signed)
Pt to ER with c/o sore throat and sinus congestion, pain since Friday.  Pt states symptoms of  sinus infection.
